# Patient Record
Sex: Female | Born: 1970
Health system: Southern US, Community
[De-identification: ages and names within clinical notes are randomized; demographics above are authoritative.]

## PROBLEM LIST (undated history)

## (undated) DIAGNOSIS — D649 Anemia, unspecified: Secondary | ICD-10-CM

## (undated) HISTORY — DX: Anemia, unspecified: D64.9

---

## 1998-05-13 ENCOUNTER — Other Ambulatory Visit: Admission: RE | Admit: 1998-05-13 | Discharge: 1998-05-13 | Payer: Self-pay | Admitting: Gynecology

## 1999-01-16 ENCOUNTER — Inpatient Hospital Stay (HOSPITAL_COMMUNITY): Admission: AD | Admit: 1999-01-16 | Discharge: 1999-01-16 | Payer: Self-pay | Admitting: Obstetrics and Gynecology

## 1999-02-17 ENCOUNTER — Other Ambulatory Visit: Admission: RE | Admit: 1999-02-17 | Discharge: 1999-02-17 | Payer: Self-pay | Admitting: Obstetrics and Gynecology

## 1999-09-08 ENCOUNTER — Inpatient Hospital Stay (HOSPITAL_COMMUNITY): Admission: AD | Admit: 1999-09-08 | Discharge: 1999-09-11 | Payer: Self-pay | Admitting: *Deleted

## 1999-09-12 ENCOUNTER — Encounter: Admission: RE | Admit: 1999-09-12 | Discharge: 1999-12-11 | Payer: Self-pay | Admitting: Obstetrics and Gynecology

## 1999-10-06 ENCOUNTER — Other Ambulatory Visit: Admission: RE | Admit: 1999-10-06 | Discharge: 1999-10-06 | Payer: Self-pay | Admitting: Obstetrics and Gynecology

## 2000-10-18 ENCOUNTER — Other Ambulatory Visit: Admission: RE | Admit: 2000-10-18 | Discharge: 2000-10-18 | Payer: Self-pay | Admitting: Obstetrics and Gynecology

## 2001-04-26 ENCOUNTER — Other Ambulatory Visit: Admission: RE | Admit: 2001-04-26 | Discharge: 2001-04-26 | Payer: Self-pay | Admitting: Obstetrics and Gynecology

## 2001-05-26 ENCOUNTER — Inpatient Hospital Stay (HOSPITAL_COMMUNITY): Admission: AD | Admit: 2001-05-26 | Discharge: 2001-05-26 | Payer: Self-pay | Admitting: Obstetrics and Gynecology

## 2001-12-06 ENCOUNTER — Inpatient Hospital Stay (HOSPITAL_COMMUNITY): Admission: AD | Admit: 2001-12-06 | Discharge: 2001-12-09 | Payer: Self-pay | Admitting: Obstetrics and Gynecology

## 2001-12-10 ENCOUNTER — Encounter: Admission: RE | Admit: 2001-12-10 | Discharge: 2002-01-09 | Payer: Self-pay | Admitting: Obstetrics and Gynecology

## 2002-07-10 ENCOUNTER — Other Ambulatory Visit: Admission: RE | Admit: 2002-07-10 | Discharge: 2002-07-10 | Payer: Self-pay | Admitting: Obstetrics and Gynecology

## 2002-07-15 ENCOUNTER — Encounter: Payer: Self-pay | Admitting: Obstetrics and Gynecology

## 2002-07-15 ENCOUNTER — Encounter: Admission: RE | Admit: 2002-07-15 | Discharge: 2002-07-15 | Payer: Self-pay | Admitting: Obstetrics and Gynecology

## 2002-08-03 ENCOUNTER — Emergency Department (HOSPITAL_COMMUNITY): Admission: EM | Admit: 2002-08-03 | Discharge: 2002-08-04 | Payer: Self-pay | Admitting: Emergency Medicine

## 2002-08-04 ENCOUNTER — Encounter: Payer: Self-pay | Admitting: Emergency Medicine

## 2002-08-06 ENCOUNTER — Encounter: Payer: Self-pay | Admitting: Specialist

## 2002-08-06 ENCOUNTER — Encounter: Admission: RE | Admit: 2002-08-06 | Discharge: 2002-08-06 | Payer: Self-pay | Admitting: Specialist

## 2003-03-10 ENCOUNTER — Encounter: Payer: Self-pay | Admitting: Obstetrics and Gynecology

## 2003-03-10 ENCOUNTER — Encounter: Admission: RE | Admit: 2003-03-10 | Discharge: 2003-03-10 | Payer: Self-pay | Admitting: Obstetrics and Gynecology

## 2003-05-09 ENCOUNTER — Ambulatory Visit (HOSPITAL_COMMUNITY): Admission: RE | Admit: 2003-05-09 | Discharge: 2003-05-09 | Payer: Self-pay | Admitting: *Deleted

## 2003-05-09 ENCOUNTER — Encounter (INDEPENDENT_AMBULATORY_CARE_PROVIDER_SITE_OTHER): Payer: Self-pay | Admitting: Specialist

## 2003-10-15 ENCOUNTER — Other Ambulatory Visit: Admission: RE | Admit: 2003-10-15 | Discharge: 2003-10-15 | Payer: Self-pay | Admitting: Obstetrics and Gynecology

## 2003-12-03 ENCOUNTER — Other Ambulatory Visit: Admission: RE | Admit: 2003-12-03 | Discharge: 2003-12-03 | Payer: Self-pay | Admitting: Obstetrics and Gynecology

## 2004-11-08 ENCOUNTER — Inpatient Hospital Stay (HOSPITAL_COMMUNITY): Admission: AD | Admit: 2004-11-08 | Discharge: 2004-11-08 | Payer: Self-pay | Admitting: Obstetrics and Gynecology

## 2004-12-02 ENCOUNTER — Other Ambulatory Visit: Admission: RE | Admit: 2004-12-02 | Discharge: 2004-12-02 | Payer: Self-pay | Admitting: Obstetrics and Gynecology

## 2005-01-14 ENCOUNTER — Ambulatory Visit (HOSPITAL_COMMUNITY): Admission: RE | Admit: 2005-01-14 | Discharge: 2005-01-14 | Payer: Self-pay | Admitting: Obstetrics and Gynecology

## 2005-02-07 ENCOUNTER — Inpatient Hospital Stay (HOSPITAL_COMMUNITY): Admission: AD | Admit: 2005-02-07 | Discharge: 2005-02-07 | Payer: Self-pay | Admitting: Obstetrics and Gynecology

## 2005-02-25 ENCOUNTER — Other Ambulatory Visit: Admission: RE | Admit: 2005-02-25 | Discharge: 2005-02-25 | Payer: Self-pay | Admitting: Obstetrics and Gynecology

## 2005-03-23 ENCOUNTER — Ambulatory Visit (HOSPITAL_COMMUNITY): Admission: RE | Admit: 2005-03-23 | Discharge: 2005-03-23 | Payer: Self-pay | Admitting: Obstetrics and Gynecology

## 2005-03-23 IMAGING — RF DG HYSTEROGRAM
4 series · 4 of 4 positions shown · non-contrast
Comparison: None.

CLINICAL DATA: Recurrent pregnancy loss.
 HYSTEROGRAM:

[Series 1: run · 1 of 1 slices shown (1 of 4)]
[im 1/1]
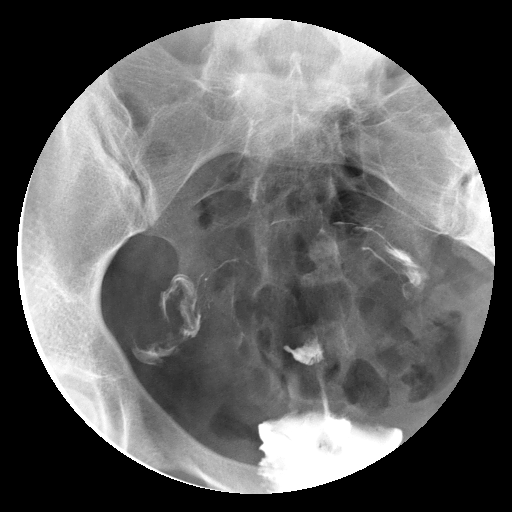

[Series 2: run · 1 of 1 slices shown (2 of 4)]
[im 1/1]
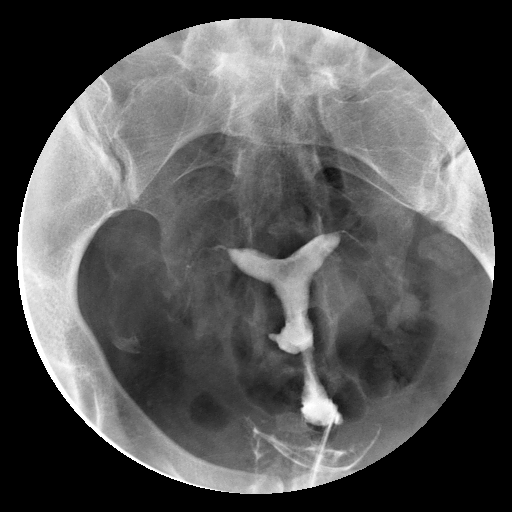

[Series 3: run · 1 of 1 slices shown (3 of 4)]
[im 1/1]
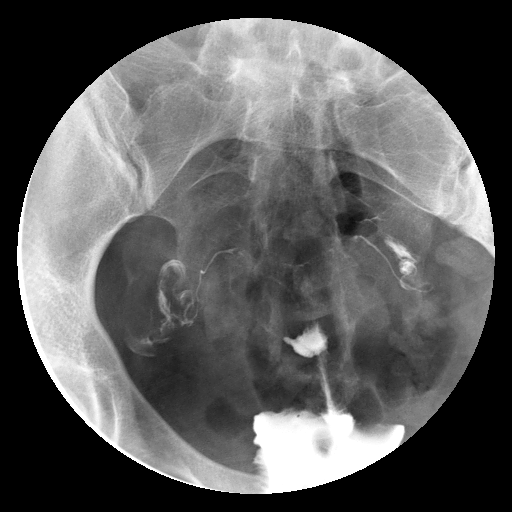

[Series 4: run · 1 of 1 slices shown (4 of 4)]
[im 1/1]
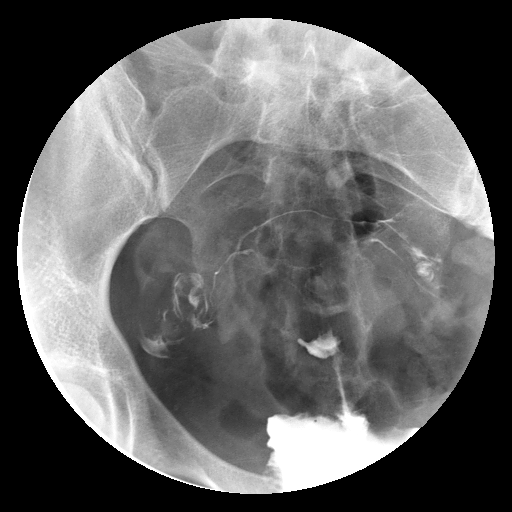

[4 of 4 positions shown; findings below may reference images not displayed]

Procedure was performed by Dr. TIN.  Four spot fluor-images were submitted after the procedure for review.
 The first image demonstrates a lack of contrast in the endometrial cavity although both ovarian tubes are opacified to the level of the ampulla bilaterally.  A second image shows the endometrial cavity to be of normal size and contour although the endometrial cavity is not well opacified.  There is a focal collection in the region of the lower uterine segment with a finger of contrast extending to the right.  This may be related to a component of fibroid change or scar.  On the third image the contrast has refluxed from the uterus into the vagina although both ovarian tubes remain opacified. Free spill of contrast from the right tube is noted on this image.  
 The final image also shows nonopacification of the endometrial cavity although contrast spill from the left ovarian tube is now apparent.
IMPRESSION: 1.  Patent ovarian tubes bilaterally.
 2.  Small collection of contrast at the level of the lower uterine segment/internal cervical os with a finger of contrast extending to the right.  This may be related to fibroid change or scarring, but is nonspecific in appearance.

## 2005-08-23 ENCOUNTER — Other Ambulatory Visit: Admission: RE | Admit: 2005-08-23 | Discharge: 2005-08-23 | Payer: Self-pay | Admitting: Obstetrics and Gynecology

## 2006-03-17 ENCOUNTER — Encounter (INDEPENDENT_AMBULATORY_CARE_PROVIDER_SITE_OTHER): Payer: Self-pay | Admitting: Specialist

## 2006-03-17 ENCOUNTER — Inpatient Hospital Stay (HOSPITAL_COMMUNITY): Admission: AD | Admit: 2006-03-17 | Discharge: 2006-03-20 | Payer: Self-pay | Admitting: Obstetrics and Gynecology

## 2006-03-21 ENCOUNTER — Encounter: Admission: RE | Admit: 2006-03-21 | Discharge: 2006-04-20 | Payer: Self-pay | Admitting: Obstetrics and Gynecology

## 2006-04-11 ENCOUNTER — Ambulatory Visit (HOSPITAL_COMMUNITY): Admission: RE | Admit: 2006-04-11 | Discharge: 2006-04-11 | Payer: Self-pay | Admitting: Obstetrics and Gynecology

## 2006-04-11 ENCOUNTER — Encounter: Payer: Self-pay | Admitting: Vascular Surgery

## 2006-04-21 ENCOUNTER — Encounter: Admission: RE | Admit: 2006-04-21 | Discharge: 2006-05-21 | Payer: Self-pay | Admitting: Obstetrics and Gynecology

## 2006-05-22 ENCOUNTER — Encounter: Admission: RE | Admit: 2006-05-22 | Discharge: 2006-05-30 | Payer: Self-pay | Admitting: Obstetrics and Gynecology

## 2006-07-22 ENCOUNTER — Encounter: Admission: RE | Admit: 2006-07-22 | Discharge: 2006-07-25 | Payer: Self-pay | Admitting: Obstetrics and Gynecology

## 2009-03-18 ENCOUNTER — Encounter: Admission: RE | Admit: 2009-03-18 | Discharge: 2009-03-18 | Payer: Self-pay | Admitting: Obstetrics and Gynecology

## 2010-03-22 ENCOUNTER — Encounter: Admission: RE | Admit: 2010-03-22 | Discharge: 2010-03-22 | Payer: Self-pay | Admitting: Obstetrics and Gynecology

## 2010-10-03 ENCOUNTER — Encounter: Payer: Self-pay | Admitting: Obstetrics and Gynecology

## 2011-01-28 NOTE — Discharge Summary (Signed)
[facility] of [facility]  Patient:    Brianna Ward, Brianna Ward Visit Number: [id number] MRN: [id number]          Service Type: OBS Location: 910A 9106 01 Attending Physician:  Esmeralda Arthur Dictated by:   Mack Guise, C.N.M. Admit Date:  12/06/2001 Discharge Date: 12/09/2001                             Discharge Summary  ADMITTING DIAGNOSES:          1. Intrauterine pregnancy at term.                               2. Previous low transverse cesarean section                                  with desire for repeat.  PROCEDURE:                    Repeat low transverse cesarean section.  DISCHARGE DIAGNOSES:          1. Intrauterine pregnancy at term.                               2. Previous low transverse cesarean section                                  with desire for repeat.                               3. Repeat low transverse cesarean section.  HISTORY OF PRESENT ILLNESS:   Ms. Fong is a 40 year old gravida 4, para 1-0-1-1, who presented at term for repeat cesarean delivery.  She underwent that procedure on December 06, 2001, by Dr. Dois Davenport Rivard with the birth of an 8 pound 9 ounce female infant named [name], with Apgar scores of 9 at one minute, 9 at five minutes.  Ms. Ventresca has done well in the immediate postoperative period.  Her hemoglobin on the first postoperative day is 10.0 and on the third postoperative day, her vital signs are stable and she is not experiencing any untoward difficulties.  She is judged to be in satisfactory condition for discharge.  DISCHARGE INSTRUCTIONS:       Per [facility] handout.  DISCHARGE MEDICATIONS:        1. Motrin 600 mg p.o. q.6h. p.r.n. pain.                               2. Tylox 1-2 p.o. q.3-4h. pain.                               3. Prenatal vitamins.  DISCHARGE FOLLOW-UP:          Discharge follow-up will be at [facility] in six weeks. Dictated by:   Mack Guise, C.N.M. Attending Physician:   Esmeralda Arthur DD:  12/09/01 TD:  12/09/01 Job: [id number]

## 2011-01-28 NOTE — H&P (Signed)
[facility] of [facility]  Patient:    Brianna Ward                        MRN: [id number] Adm. Date:  [id number] Attending:  Ardeen Fillers CC:         Sung Amabile. Roslyn Smiling, M.D.             Outpatient Surgery                         History and Physical  CHIEF COMPLAINT:  Breech presentation at 38+ weeks gestational age, borderline amniotic fluid.  HISTORY OF PRESENT ILLNESS:  A 40 year old woman, G3, P0-0-2-0, EDC 09/19/1999 based on dates and early ultrasound, admitted for primary cesarean section because of  persistent breech presentation, borderline low amniotic fluid (14th percentile n December 22) and slightly elevated Doppler studies.  The patient has been counseled regarding the benefits and risks of cesarean section versus external cephalic ____________ .  Because of the fluid and elevation of the Dopplers, cesarean section has been recommended.  She has been counseled regarding the benefits, risks and options prior to surgery and consent has been obtained.  Antenatal course has been remarkable for  1. First trimester bleeding.  The patient received progesterone suppositories until    [redacted] weeks gestational age. 2. Hyperemesis gravidarum requiring Zofran. 3. Anemia which appeared in the second trimester treated with iron. 4. Gastroesophageal reflux disease treated with Zantac in the second trimester. 5. Hemorrhoids diagnosed by Dr. Charna Elizabeth and treated with [facility]    suppositories. 6. Borderline low amniotic fluid diagnosed at the time of the breech presentation    in December.  The patient has had no evidence of pregnancy induced hypertension    and is a nonsmoker.  She has been observing partial bedrest which has not    affected the amount of fluid around the baby.  Estimated fetal weight was in the    61st percentile two weeks prior to delivery.  PAST MEDICAL HISTORY:  Pyelonephritis 1989.  PAST SURGICAL HISTORY:  None.  ALLERGIES:   None.  MEDICATIONS:  Prenatal vitamins and iron, occasional Zantac.  OBSTETRICAL:  1991 first trimester TAB without complications.  January 2000 first trimester spontaneous abortion without D&C.  FAMILY HISTORY:  Father with hypertension and heart disease.  SOCIAL HISTORY:  Married.  Audiologist.  Denies tobacco or ethanol use. Vegetarian.  PHYSICAL EXAMINATION:  GENERAL:  Healthy-appearing woman.  VITAL SIGNS:  Afebrile.  Recent blood pressure in the office 110/70.  Fetal heart tones heard.  Most recent weight 166 pounds (total weight gain 22).  HEENT:  Within normal limits.  NECK:  Without thyromegaly.  Chest clear.  HEART:  Regular rate and rhythm, S1 and S2 normal.  ABDOMEN:  Soft, nontender, gravid.  Breech presentation and fundal height 36 cm.  GU:  1 cm dilated, 2 cm long, -2 station.  EXTREMITIES:  Without clubbing, cyanosis or edema.  NEUROLOGIC:  2+ DTRs without clonus.  LABS (ANTENATAL):  O negative (the patient received RhoGAM in the first trimester after bleeding noted and again at 28 weeks).  RPR nonreactive.  Hepatitis B surface antigen negative.  HIV nonreactive.  Rubella immune.  CMV negative.  Maternal serum alpha fetoprotein triple screen within normal limits.  One hour Glucola 123 mg%, group B strep negative.  IMPRESSION: 1. Intrauterine pregnancy [redacted] weeks gestational age. 2. Breech presentation. 3. Borderline low amniotic  fluid without obvious etiology. 4. Rh negative. 5. Vegetarian.  PLAN:  Primary cesarean section.  RhoGAM work-up postpartum. DD:  09/08/99 TD:  09/08/99 Job: [id number]

## 2011-01-28 NOTE — H&P (Signed)
[facility] of [facility]  Patient:    Brianna Ward, Brianna Ward Visit Number: [id number] MRN: [id number]          Service Type: OBS Location: 910A 9106 01 Attending Physician:  Esmeralda Arthur Dictated by:   Nigel Bridgeman, C.N.M. Admit Date:  12/06/2001                           History and Physical  HISTORY OF PRESENT ILLNESS:   Ms. Brianna Ward is a 40 year old gravida 4, para 1-0-1-1, who presents today at approximately 38 weeks for a scheduled cesarean section secondary to previous cesarean section with the desire for repeat.  The pregnancy has been remarkable for: 1. Rh negative.  2. History of C-section with desire for repeat.  3. History of oligohydramnios in present pregnancy.  4. The patient a vegetarian.  5. History of anemia.  6. First trimester spotting.  PRENATAL LABORATORIES:        Blood type is O negative.  Rh antibody showed anti-D.  RPR is negative.  Rubella titer is immune.  Hepatitis B surface antigen is negative.  HIV is nonreactive.  GC and Chlamydia cultures were negative.  Group B strep culture was negative at 36 weeks.  EDC of December 13, 2001, was established by last menstrual period; [facility] of December 09, 2001, was established by ultrasound in August.  HISTORY OF PRESENT PREGNANCY:                    The patient entered care at [facility] at approximately six weeks.  She had an ultrasound at seven weeks that showed accurate dating.  She originally was undecided about a VBAC, but then elected to repeat her C-section.  She had RhoGAM received in August and had a subsequent anti-D antibody titer done.  CMV titers were done which were negative.  She had some first trimester spotting, but then resolved.  She did use Zofran for early pregnancy nausea and vomiting.  AFP was normal.  She had an ultrasound at 21 weeks that showed normal development.  She elected to transfer her care to [facility] in the late third trimester upon Dr. Antonietta Breach leaving to  go to [facility].  OBSTETRICAL HISTORY:          In 1991 she had a therapeutic termination of pregnancy without complication.  In January of 2000 she had a spontaneous miscarriage at 2-1/2 months pregnancy; she had no D&C and no complications. In December of 2000 she had a planned C-section, primary low transverse cesarean section, for a female infant, weight 6 pounds 11 ounces at [redacted] weeks gestation; she was found to have a breech infant and oligohydramnios.  She did have some borderline gestational anemia with that last pregnancy and oligohydramnios.  She did receive RhoGAM.  MEDICAL HISTORY:              She had a TAB in 1991.  She reports she has received the usual childhood illnesses and immunizations.  The patient has had anemia in the past as a child.  She had pyelonephritis in 1989.  She was hospitalized at that time.  ALLERGIES:                    She has no known medication allergies.  FAMILY HISTORY:               Her father, paternal grandfather and paternal uncle all  have had MIs.  Father is hypertensive, on medication.  Maternal great-uncle did have some mental illnesses.  GENETIC HISTORY:              Remarkable for the patients second cousin with Tourettes syndrome.  SOCIAL HISTORY:               The patient is married to the father of the baby, he is involved and supportive, his name is Jillyn Hidden.  The patient is graduate educated, she is employed as an Biomedical scientist.  Her husband is employed in Airline pilot, he is college educated.  She has been followed by Dr. Estanislado Pandy during her pregnancy.  She is Caucasian.  She denies any alcohol, drug or tobacco use during this pregnancy.  PHYSICAL EXAMINATION:  VITAL SIGNS:                  Vital signs are stable, patient is afebrile.  HEENT:                        Within normal limits.  LUNGS:                        Bilateral breath sounds are clear.  HEART:                        Regular rate and rhythm without  murmur.  BREASTS:                      Soft and nontender.  ABDOMEN:                      Fundal height is approximately 38 cm.  Estimated fetal weight is 8 pounds to 8 pounds 5 ounces.  Uterine contractions are very occasional and mild.  Fetal heart rate is in the 140s by Doppler.  EXTREMITIES:                  Deep tendon reflexes are 2+ without clonus. There is a trace edema noted.  IMPRESSION: 1. Intrauterine pregnancy at 38 weeks. 2. Previous low transverse cesarean section, with desire for repeat. 3. Rh negative.  PLAN: 1. Admit to the [facility] for consult with Dr. Silverio Lay as attending physician. 2. Routine physician preoperative orders. Dictated by:   Nigel Bridgeman, C.N.M. Attending Physician:  Esmeralda Arthur DD:  12/07/01 TD:  12/07/01 Job: [id number]

## 2011-01-28 NOTE — Op Note (Signed)
   NAMEELEKTRA, WARTMAN                          ACCOUNT NO.:  [id number]   MEDICAL RECORD NO.:  [id number]                   PATIENT TYPE:  AMB   LOCATION:  DAY                                  FACILITY:  [facility]   PHYSICIAN:  Vikki Ports, M.D.         DATE OF BIRTH:  1971-04-18   DATE OF PROCEDURE:  05/09/2003  DATE OF DISCHARGE:                                 OPERATIVE REPORT   PREOPERATIVE DIAGNOSIS:  Bilateral breast masses.   POSTOPERATIVE DIAGNOSIS:  Bilateral breast masses.   PROCEDURE:  Excisional bilateral breast biopsies.   SURGEON:  Vikki Ports, M.D.   ANESTHESIA:  General.   DESCRIPTION OF PROCEDURE:  The patient was taken to the operating room,  placed in supine position and after adequate general anesthesia was induced  using laryngeal mask, bilateral breasts were prepped and draped in the  normal sterile fashion. Using a curvilinear periareolar incision in the  right breast, I dissected down onto the palpable mass in the left areolar  region. This was grasped with an Allis clamp and excised. Adequate  hemostasis was ensured and the skin was closed with subcuticular 4-0  Monocryl.   I then turned my attention to the left breast and a similar incision was  made in the 7 o'clock region of the periareolar breast. We dissected down to  the palpable retro-areolar mass which was grasped with an Allis clamp,  excised, adequate hemostasis was ensured using Bovie electrocautery. The  skin was closed with subcuticular 4-0 Monocryl, Steri-Strips and sterile  dressings were applied. The patient tolerated the procedure well and went to  PACU in good condition.                                               Vikki Ports, M.D.    KRH/MEDQ  D:  05/09/2003  T:  05/10/2003  Job:  [id number]

## 2011-01-28 NOTE — H&P (Signed)
[facility] of [facility]  Patient:    Brianna Ward, Brianna Ward Visit Number: [id number] MRN: [id number]          Service Type: OBS Location: MATC Attending Physician:  Esmeralda Arthur Admit Date:  05/26/2001                           History and Physical  ER NOTE  REASON FOR CONSULTATION:      Low back pain.  HISTORY OF PRESENT ILLNESS:   This is a 40 year old married white female, gravida 4, para 1, at [redacted] weeks pregnant, who is complaining of lower back pain for the last 24 hours with a low-grade fever last night of 101.5, now back to 99.  She is known for hyperemesis currently using folic acid and Zofran and has been keeping her food and her fluids down, but for the last 24-48 hours, she has been not feeling very good, low-grade fever, low back pain, no bleeding, no abdominal pain.  Bowel movements have been difficult with constipation.  She has been exposed in the last week to two persons, her mother and her neighbor, with a 24-hour bug, low-grade fever, and diarrhea.  REVIEW OF SYSTEMS:            Otherwise negative.  PAST MEDICAL HISTORY:         1. Cesarean section in 2000.                               2. History of pyelonephritis in 1989.                               3. History of first trimester bleeding, now                                  resolved.                               4. Previous history of oligohydramnios.  SOCIAL HISTORY:               Married, nonsmoker.  PHYSICAL EXAMINATION:  VITAL SIGNS:                  Blood pressure 114/68, current temperature 99.8, pulse 89, no acute distress.  HEENT:                        Negative.  LUNGS:                        Clear.  HEART:                        Normal.  ABDOMEN:                      Bowel sounds present, soft, nontender, no CVA tenderness.  EXTREMITIES:                  Negative.  FETAL HEART RATE:             Positive at 150 per minute.  LABORATORY:  A cath  urinalysis is negative.  ASSESSMENT:                   Intrauterine pregnancy at 11 weeks with low back pain and low-grade fever in a patient exposed to viremia in the last week. This is a probable viral infection with myalgia.  No evidence of urinary tract infection or bacterial infection.  PLAN:                         The patient is discharged home reassured, instructed to use Tylenol 650 mg q.4-6h. around-the-clock for the next 24-48 hours and to monitor her temperature.  She is instructed to call back if temperature is above 100.4.  She is also instructed to drink plenty of water and to use Fibercon for her constipation.  She will be seen in the office on May 29, 2001, for a follow-up. Attending Physician:  Esmeralda Arthur DD:  05/26/01 TD:  05/26/01 Job: [id number]

## 2011-01-28 NOTE — Op Note (Signed)
[name], Brianna Ward                ACCOUNT NO.:  [id number]   MEDICAL RECORD NO.:  [id number]          PATIENT TYPE:  INP   LOCATION:  9124                          FACILITY:  [facility]   PHYSICIAN:  Michelle L. Grewal, M.D.DATE OF BIRTH:  1971/07/13   DATE OF PROCEDURE:  03/17/2006  DATE OF DISCHARGE:                                 OPERATIVE REPORT   PREOPERATIVE DIAGNOSIS:  Intrauterine pregnancy at term, previous cesarean  section x2 and desires permanent sterilization.   POSTOPERATIVE DIAGNOSIS:  Intrauterine pregnancy at term, previous cesarean  section x2 and desires permanent sterilization.   PROCEDURE:  Repeat low transverse cesarean section and bilateral tubal  ligation modified Pomeroy method.   SURGEON:  Michelle L. Vincente Poli, M.D.   ANESTHESIA.:  Spinal.   SPECIMENS:  Female infant, cephalic presentation, Apgars 8 at one minute, 9  at five minutes, weighing 6 pounds 14 ounces.   ESTIMATED BLOOD LOSS:  500 mL.   PROCEDURE:  Patient taken to the operating room.  Her spinal was placed  without incident.  She was then prepped and draped in the usual sterile  fashion.  A Foley catheter was inserted.  A low transverse incision was  made, carried down to the fascia.  The fascia scored in the midline and  extended laterally.  The rectus muscles were separated in the midline and  the peritoneum was entered.  The bladder blade was inserted.  The lower  uterine segment was identified and the bladder flap was created sharply and  then digitally. The bladder blade was readjusted.  A low transverse incision  was made in the uterus.  The uterus was entered using a hemostat.  The  amniotic fluid was clear.  The baby was in cephalic presentation and was  delivered easily with a vacuum extractor.  Apgars were 8 at one minute and 9  at five minutes and the baby weighed 6 pounds, 14 ounces.  The cord was  clamped and cut, the baby was handed to the waiting pediatrician. The  placenta was  manually removed, noted be normal, intact with three-vessel  cord.  The uterus was then exteriorized and cleared of all clots and debris.  The uterine incision was closed in one layer using 0 chromic suture,  continuous running stitch.  It was __________ hemostat.  At this point we  then performed a modified Pomeroy bilateral tubal ligation by grasping each  midportion of tube with a Babcock.  The fimbriated ends were visualized  easily.  A 3 cm knuckle tied off using plain gut suture x2 and the tied off  knuckle was excised using Metzenbaum scissors.  Hemostasis was excellent.  The uterus was returned to the abdomen.  Irrigation was performed.  All  three surgical pedicles were inspected and noted to be hemostatic.  Peritoneum and  rectus muscles were reapproximated using 0 Vicryl.  The fascia was closed  using 0 Vicryl x2 starting each corner meeting in the midline.  After  irrigation of the subcutaneous layer. The skin was closed with staples.  All  sponge, lap and counts were correct  x2.  The patient went recovery room in  stable condition.      Michelle L. Vincente Poli, M.D.  Electronically Signed     MLG/MEDQ  D:  03/17/2006  T:  03/17/2006  Job:  [id number]

## 2011-01-28 NOTE — Discharge Summary (Signed)
[name], [name]                ACCOUNT NO.:  [id number]   MEDICAL RECORD NO.:  [id number]          PATIENT TYPE:  INP   LOCATION:  9128                          FACILITY:  [facility]   PHYSICIAN:  Michelle L. Grewal, M.D.DATE OF BIRTH:  10-14-1970   DATE OF ADMISSION:  03/17/2006  DATE OF DISCHARGE:  03/20/2006                                 DISCHARGE SUMMARY   ADMITTING DIAGNOSES:  1. Intrauterine pregnancy at term.  2. Previous cesarean section, desires repeat.  3. Multiparity, desires permanent sterilization.   DISCHARGE DIAGNOSES:  1. Status post low transverse cesarean section.  2. Viable female infant.  3. Permanent sterilization.   PROCEDURE:  1. Repeat low transverse cesarean section.  2. Bilateral tubal ligation.   REASON FOR ADMISSION:  Please see written H&P.   HOSPITAL COURSE:  The patient is a 40 year old white married female gravida  6, para 2 that presented to [facility] for a scheduled  cesarean section.  Due to multiparity, the patient has also requested  bilateral tubal ligation.  On the morning of admission the patient was taken  to the operating room where spinal anesthesia was administered without  difficulty.  A low transverse incision was made with the delivery of a  viable female infant weighing 6 pounds 14 ounces with Apgars of 8 at one  minute and 9 at five minutes.  The patient tolerated her procedure well and  was taken to the recovery room in stable condition.  On postoperative day #1  the patient was without complaint.  Vital signs were stable.  She was  afebrile.  Fundus firm and nontender.  Abdominal dressing was noted with a  small amount of serosanguineous drainage noted on bandage.  Laboratory  findings showed hemoglobin of 10.2; platelet count 140,000.  The patient was  known to be Rh negative and RhoGAM was indicated.  On postoperative day #2  the patient was without complaint.  Vital signs were stable.  She was  afebrile.   Fundus firm and nontender.  Incision was clean, dry and intact.  CBC had revealed platelets up to 149,000.  The patient was now tolerating a  regular diet without complaints of nausea and vomiting.  On postoperative  day #3 the patient was without complaint.  Vital signs remained stable.  She  was afebrile.  Fundus firm and nontender.  Incision was clean, dry and  intact.  Staples were removed and the patient was discharged home.   CONDITION ON DISCHARGE:  Stable.   DIET:  Regular as tolerated.   ACTIVITY:  No heavy lifting, no driving x2 weeks, no vaginal entry.   FOLLOWUP:  The patient is to follow up in the office in 2 weeks for an  incision check.  She is to call for temperature greater than 100 degrees,  persistent nausea and vomiting, heavy vaginal bleeding, and/or redness or  drainage from the incisional site.   DISCHARGE MEDICATIONS:  1. Tylox #30 one p.o. q.4-6h. p.r.n.  2. Motrin 600 mg every 6 hours.  3. Prenatal vitamins one p.o. daily.  4. Colace one p.o.  daily p.r.n.      Julio Sicks, N.P.      Stann Mainland. Vincente Poli, M.D.  Electronically Signed    CC/MEDQ  D:  05/02/2006  T:  05/02/2006  Job:  [id number]

## 2011-01-28 NOTE — Op Note (Signed)
[facility]  Patient:    Brianna Ward                        MRN: [id number] Proc. Date: 09/08/99 Adm. Date:  [id number] Attending:  Ardeen Fillers CC:         Sung Amabile. Roslyn Smiling, M.D.                           Operative Report  PREOPERATIVE DIAGNOSIS:  Breech presentation.  Intrauterine pregnancy at 38+ weeks gestational age.  Borderline low amniotic fluid index.  POSTOPERATIVE DIAGNOSIS:  Breech presentation.  Intrauterine pregnancy at 38+ weeks gestational age.  Borderline low amniotic fluid index.  Fibroids.  OPERATION PERFORMED:  Primary low transverse cesarean section.  SURGEON:  Sung Amabile. Roslyn Smiling, M.D.  ASSISTANT:  Lenoard Aden, M.D.  ANESTHESIA:  Spinal.  INDICATIONS FOR PROCEDURE:  The patient is a 40 year old woman, G3, P0-0-2-0, at 38+ weeks gestational age admitted for primary cesarean section delivery for breech presentation and borderline low AFI.  ESTIMATED BLOOD LOSS:  600 cc.  DRAINS:  Foley catheter.  COMPLICATIONS:  None.  FINDINGS:  Live frank breech female, 3135 gm, Apgars at 8 and 9.  Clear fluid. Normal tubes and ovaries.  Subserosal fibroid in the anterior left lower uterine segment and posterior left lower uterine segment.  SPECIMENS:  Cord bloods.  DESCRIPTION OF PROCEDURE:  After the establishment of spinal anesthesia, the patient was placed in the left uterine displaced position.  The abdomen was prepped, Foley catheter was placed, the patient was draped.  A Pfannenstiel incision was made on the skin with a knife.  The incision was carried to the level of the fascia using electrocautery.  The fascia was nicked in the midline.  The  fascial incision was carried laterally using scissors.  The fascia was separated from the underlying rectus muscle bellies with a combination of sharp and blunt  dissection.  The upper border of the fascia was retracted cephalad.  The rectus  muscle bellies were split in  the midline sharply.  The peritoneum was visualized and grasped in a clear space and entered there bluntly.  The peritoneal incision was carried superiorly and inferiorly taking care to avoid injury to underlying  visceral  Bladder blade was placed.  Uterine position was ascertained. Vesicouterine serosa was opened over lower uterine segment transversely.  The bladder flap was created bluntly and the bladder blade was placed on the bladder flap.  A low transverse incision was made on the uterus with a knife.  The incision was carried laterally with bandage scissors.  The presentation was right sacrum anterior.  The breech was delivered without difficulty and the baby delivered and the nasopharynx suctioned.  The cord was clamped and cut and the baby was handed off the table to the awaiting pediatricians.  Cord bloods were taken.  The placenta was removed spontaneously.  Ring forceps were used to identify the angles of the uterine incision.  The intrauterine cavity was wiped clean inside.  The uterus was exteriorized.  The uterus was closed in layers.  The first layer was a running interlocking stitch of 0 Monocryl.  The second layer was an imbricating stitch of 0 Monocryl.  Bleeding at the right angle of the uterine incision was persistent and required three single sutures through the broad ligament, then into the right border of the myometrium to control bleeding.  A small amount of bleeding was noted at the left border of the incision.  This was controlled with a mattress suture of 0 Vicryl.  Hemostasis was noted.  The uterus was returned to the abdominal cavity.  The peritoneal cavity was irrigated with warm normal saline.  Initial sponge, needle and blade counts were correct.  Hemostasis was noted.  The anterior abdominal wall was closed in layers.  The subfascial layer was irrigated with warm normal saline.  Electrocautery was used for hemostasis. Vicryl 2-0 mattress  was used to reapproximate the lower border of the rectus muscle bellies.  The fascia was closed from either angle using a running stitch of 0 Vicryl tying in the midline independently.  The subcutaneous fat was irrigated ith warm normal saline.  Electrocautery was used for hemostasis.  0.25% Marcaine was used to infiltrate the incisional edges.  The skin was closed with staples. Final sponge, needle and blade counts were correct.  The urine was clear at the end of the case.  The patient was transported to the recovery room in satisfactory condition. DD:  09/08/99 TD:  09/09/99 Job: [id number]

## 2011-01-28 NOTE — Op Note (Signed)
[facility]  Patient:    Brianna Ward, Brianna Ward Visit Number: [id number] MRN: [id number]          Service Type: OBS Location: 910A [id number] [id number] Attending Physician:  Esmeralda Arthur Dictated by:   Silverio Lay, M.D. Admit Date:  12/06/2001                             Operative Report  PREOPERATIVE DIAGNOSIS:       Intrauterine pregnancy at 39 weeks with previous                               cesarean section.  POSTOPERATIVE DIAGNOSIS:      Intrauterine pregnancy at 39 weeks with previous                               cesarean section.  OPERATION:                    Repeat low transverse cesarean section.  SURGEON:                      Silverio Lay, M.D.  ASSISTANT:                    Nigel Bridgeman, C.N.M.  ANESTHESIA:                   Spinal.  ESTIMATED BLOOD LOSS:         600 cc.  DESCRIPTION OF PROCEDURE:     After being informed of the planned procedure with possible complications including bleeding, infection, injury to bowel, bladder, or ureter, informed consent was obtained, and the patient was taken to the cesarean suite, and given spinal anesthesia without complication.  She was placed in the dorsal decubitus with pelvis tilted to the left, prepped and draped in a sterile fashion, and a Foley catheter was inserted in her bladder.  After assessing adequate level of anesthesia, previous Pfannenstiel incision was infiltrated with 10 cc of Marcaine 0.25.  We proceeded with an incision of skin, subcutaneous tissue, and fat with knife overlooking the previous incision.  We then proceeded with incision of fascia in the transverse fashion, first with knife, and then with Mets.  At that time, we noted a complete diastasis of rectus abdominis muscles.  We then proceeded with opening of the visceroperitoneum with Met scissors, allowing [place] to develop bladder flap and safely retract the bladder.  In that process, due to large blood vessels, some  bleeding occurred, and was controlled with two figure-of-eight stitches of 3-0 Vicryl.  We then proceeded with opening of the myometrium in a low transverse fashion, first with knife, and then bluntly. Amniotic fluid was clear.  We assisted the birth of a female infant using vacuum extraction in a left OA presentation at 60.  Mouth and nose were suctioned. Cord was clamped with two Kelly clamps and sectioned, and the baby was given to the pediatrician present in the room.  Twenty cubic centimeters of blood were drawn from the umbilical vein, and placenta was allowed to deliver spontaneously.  The placenta was complete.  The cord had three vessels and uterine revision was negative.  We then proceeded with closure of myometrium in two planes, first with a running locked suture of 0  Vicryl, then with a Lembert suture of 0 Vicryl, covering the first one.  Hemostasis was then completed with cautery. Hemostasis was felt to be adequate.  Both pericolic gutters were cleansed with warm saline.  Both tubes and ovaries assessed and normal.  We then irrigated the pelvis with warm saline and assessed hemostasis which was adequate.  Under fascia, hemostasis was completed with cautery, and the rectus abdominis muscle were reapproximated with three figure-of-eight stitches of 0 Vicryl. We then proceeded with closure of fascia with two running sutures of 0 Vicryl meeting midline.  Fat was irrigated with warm saline.  Hemostasis was completed with cautery and the skin was closed with staples.  A baby boy named [name] had an Apgar of 9 at one minute and 9 at five minutes, and weighed 8 pounds and 9 ounces.  Instrument and sponge count is complete x2. Estimated blood loss is 600 cc.  The procedure was very well-tolerated by the patient who is taken to the recovery room in a well and stable condition. Dictated by:   Silverio Lay, M.D. Attending Physician:  Esmeralda Arthur DD:  12/06/01 TD:   12/08/01 Job: [id number]

## 2011-01-28 NOTE — Discharge Summary (Signed)
[facility]  Patient:    Brianna Ward                        MRN: [id number] Adm. Date:  [id number] Disc. Date: [id number] Attending:  Ardeen Fillers                           Discharge Summary  DISCHARGE DIAGNOSES:          1. Intrauterine pregnancy at term.                               2. Breech presentation.                               3. Borderline low amniotic fluid.                               4. Rh negative, RhoGAM given postpartum.                               5. Subserosal fibroid.                               6. Postoperative anemia.  PROCEDURE:                    Primary low transverse cesarean section.  HISTORY OF PRESENT ILLNESS:   A 40 year old woman, gravida 3, para 0-0-2-0, [facility]  September 19, 1999, based on dates and early ultrasound, admitted for primary cesarean section because of persistent breech presentation and borderline low amniotic fluid with slightly elevated Doppler studies.  The patient had been counseled regarding the benefits and risks of a cesarean section versus external cephalic version.  Because of decreased fluid and elevation of Dopplers cesarean section has been recommended.  She was counseled regarding the benefits, risks, and options prior to surgery and consent has been obtained.  The antenatal course was remarkable for:  1) First trimester bleeding.  The patient received progesterone suppositories until [redacted] weeks gestational age. 2) Hyperemesis gravidarum requiring Zofran.  3) Anemia which appeared in the second trimester treated with iron.  4) Gastroesophageal reflux treated with Zantac in the second trimester.  5) Hemorrhoids diagnosed by Anselmo Rod, M.D. treated with Anusol HC suppositories.  6) Borderline low amniotic fluid diagnosed at the time of the breech presentation in December.  The patient had no evidence of pregnancy-induced hypertension and is a nonsmoker.  She has been  observing partial bed rest which has not effected the amount of fluid around the baby.  Estimated  fetal weight was in the 61st percentile two weeks prior to delivery.  HOSPITAL COURSE:              The patient was admitted to [facility] [place].  She was taken to surgery on the date of admission where she underwent primary low transverse cesarean section by Sung Amabile. Roslyn Smiling, M.D. and Lenoard Aden, M.D. under spinal anesthesia.  Estimated blood loss was 600 cc and there were no complications.  At the time of delivery, a live, frank breech female, 3135 grams with Apgars of  8 and 9 was encountered.  A subserosal fibroid was noted in the anterior left lower uterine segment and posterior left lower uterine segment. These were left intact.  The patients postpartum course was remarkable for mild anemia.  Hemoglobin stabilized at 9.1.  This was well tolerated and treated conservatively.  Diet was advanced without difficulty.  The patient received RhoGAM postpartum.  Surgical  staples were removed on the third postoperative day and she was sent home that ay in satisfactory condition.  Routine status post cesarean section instructions were given.  DISCHARGE MEDICATIONS:        1. Prenatal vitamins.                               2. Iron.                               3. Tylox tablets.                               4. Nonsteroidal anti-inflammatory agents was also                                  prescribed.  FOLLOW-UP:                    She will be followed up in the office in four to ix weeks. DD:  10/02/99 TD:  10/05/99 Job: [id number]

## 2011-05-03 ENCOUNTER — Emergency Department (HOSPITAL_COMMUNITY)
Admission: EM | Admit: 2011-05-03 | Discharge: 2011-05-03 | Disposition: A | Discharge status: Home / Self Care | Payer: BC Managed Care – PPO | Attending: Emergency Medicine | Admitting: Emergency Medicine

## 2011-05-03 DIAGNOSIS — R61 Generalized hyperhidrosis: Secondary | ICD-10-CM | POA: Insufficient documentation

## 2011-05-03 DIAGNOSIS — R0789 Other chest pain: Secondary | ICD-10-CM | POA: Insufficient documentation

## 2012-03-21 ENCOUNTER — Other Ambulatory Visit: Payer: Self-pay | Admitting: Obstetrics and Gynecology

## 2012-07-20 ENCOUNTER — Other Ambulatory Visit: Payer: Self-pay | Admitting: Obstetrics and Gynecology

## 2013-08-14 ENCOUNTER — Ambulatory Visit (INDEPENDENT_AMBULATORY_CARE_PROVIDER_SITE_OTHER): Payer: BC Managed Care – PPO | Admitting: Podiatry

## 2013-08-14 ENCOUNTER — Encounter: Payer: Self-pay | Admitting: Podiatry

## 2013-08-14 ENCOUNTER — Ambulatory Visit (INDEPENDENT_AMBULATORY_CARE_PROVIDER_SITE_OTHER): Payer: BC Managed Care – PPO

## 2013-08-14 VITALS — BP 111/75 | HR 77 | Resp 16 | Ht 67.0 in | Wt 136.0 lb

## 2013-08-14 DIAGNOSIS — M722 Plantar fascial fibromatosis: Secondary | ICD-10-CM

## 2013-08-14 MED ORDER — TRIAMCINOLONE ACETONIDE 10 MG/ML IJ SUSP
10.0000 mg | Freq: Once | INTRAMUSCULAR | Status: AC
  Administered 2013-08-14: 10 mg

## 2013-08-14 NOTE — Progress Notes (Signed)
Subjective:     Patient ID: Brianna Ward, female   DOB: [birth year], 42 y.o.   MRN: [id number]  Foot Pain   patient states I am getting a lot of pain in my left heel that has been worse for the last month. I was on steroids which has given me some improvement but still quite painful and I have tried insoles ice therapy and shoe gear modification   Review of Systems  All other systems reviewed and are negative.       Objective:   Physical Exam  Nursing note and vitals reviewed. Constitutional: She is oriented to person, place, and time.  Cardiovascular: Intact distal pulses.   Musculoskeletal: Normal range of motion.  Neurological: She is oriented to person, place, and time.  Skin: Skin is warm.   neurovascular status intact with no equinus condition noted and muscle strength adequate bilateral. Upon palpation left heel is very tender at the medial fascial band calcaneal insertion with fluid buildup noted     Assessment:     Plantar fasciitis left heel with inflammation and fluid buildup at the insertion    Plan:     H&P and x-ray reviewed with patient. Injected the left plantar fascia 3 mg Kenalog 5 mg Xylocaine Marcaine mixture and applied fascially brace with instructions. If symptoms continue reappoint

## 2013-08-14 NOTE — Patient Instructions (Signed)
Plantar Fasciitis (Heel Spur Syndrome)  with Rehab  The plantar fascia is a fibrous, ligament-like, soft-tissue structure that spans the bottom of the foot. Plantar fasciitis is a condition that causes pain in the foot due to inflammation of the tissue.  SYMPTOMS   · Pain and tenderness on the underneath side of the foot.  · Pain that worsens with standing or walking.  CAUSES   Plantar fasciitis is caused by irritation and injury to the plantar fascia on the underneath side of the foot. Common mechanisms of injury include:  · Direct trauma to bottom of the foot.  · Damage to a small nerve that runs under the foot where the main fascia attaches to the heel bone.  · Stress placed on the plantar fascia due to bone spurs.  RISK INCREASES WITH:   · Activities that place stress on the plantar fascia (running, jumping, pivoting, or cutting).  · Poor strength and flexibility.  · Improperly fitted shoes.  · Tight calf muscles.  · Flat feet.  · Failure to warm-up properly before activity.  · Obesity.  PREVENTION  · Warm up and stretch properly before activity.  · Allow for adequate recovery between workouts.  · Maintain physical fitness:  · Strength, flexibility, and endurance.  · Cardiovascular fitness.  · Maintain a health body weight.  · Avoid stress on the plantar fascia.  · Wear properly fitted shoes, including arch supports for individuals who have flat feet.  PROGNOSIS   If treated properly, then the symptoms of plantar fasciitis usually resolve without surgery. However, occasionally surgery is necessary.  RELATED COMPLICATIONS   · Recurrent symptoms that may result in a chronic condition.  · Problems of the lower back that are caused by compensating for the injury, such as limping.  · Pain or weakness of the foot during push-off following surgery.  · Chronic inflammation, scarring, and partial or complete fascia tear, occurring more often from repeated injections.  TREATMENT   Treatment initially involves the use of  ice and medication to help reduce pain and inflammation. The use of strengthening and stretching exercises may help reduce pain with activity, especially stretches of the Achilles tendon. These exercises may be performed at home or with a therapist. Your caregiver may recommend that you use heel cups of arch supports to help reduce stress on the plantar fascia. Occasionally, corticosteroid injections are given to reduce inflammation. If symptoms persist for greater than 6 months despite non-surgical (conservative), then surgery may be recommended.   MEDICATION   · If pain medication is necessary, then nonsteroidal anti-inflammatory medications, such as aspirin and ibuprofen, or other minor pain relievers, such as acetaminophen, are often recommended.  · Do not take pain medication within 7 days before surgery.  · Prescription pain relievers may be given if deemed necessary by your caregiver. Use only as directed and only as much as you need.  · Corticosteroid injections may be given by your caregiver. These injections should be reserved for the most serious cases, because they may only be given a certain number of times.  HEAT AND COLD  · Cold treatment (icing) relieves pain and reduces inflammation. Cold treatment should be applied for 10 to 15 minutes every 2 to 3 hours for inflammation and pain and immediately after any activity that aggravates your symptoms. Use ice packs or massage the area with a piece of ice (ice massage).  · Heat treatment may be used prior to performing the stretching and strengthening activities prescribed   by your caregiver, physical therapist, or athletic trainer. Use a heat pack or soak the injury in warm water.  SEEK IMMEDIATE MEDICAL CARE IF:  · Treatment seems to offer no benefit, or the condition worsens.  · Any medications produce adverse side effects.  EXERCISES  RANGE OF MOTION (ROM) AND STRETCHING EXERCISES - Plantar Fasciitis (Heel Spur Syndrome)  These exercises may help you  when beginning to rehabilitate your injury. Your symptoms may resolve with or without further involvement from your physician, physical therapist or athletic trainer. While completing these exercises, remember:   · Restoring tissue flexibility helps normal motion to return to the joints. This allows healthier, less painful movement and activity.  · An effective stretch should be held for at least 30 seconds.  · A stretch should never be painful. You should only feel a gentle lengthening or release in the stretched tissue.  RANGE OF MOTION - Toe Extension, Flexion  · Sit with your right / left leg crossed over your opposite knee.  · Grasp your toes and gently pull them back toward the top of your foot. You should feel a stretch on the bottom of your toes and/or foot.  · Hold this stretch for __________ seconds.  · Now, gently pull your toes toward the bottom of your foot. You should feel a stretch on the top of your toes and or foot.  · Hold this stretch for __________ seconds.  Repeat __________ times. Complete this stretch __________ times per day.   RANGE OF MOTION - Ankle Dorsiflexion, Active Assisted  · Remove shoes and sit on a chair that is preferably not on a carpeted surface.  · Place right / left foot under knee. Extend your opposite leg for support.  · Keeping your heel down, slide your right / left foot back toward the chair until you feel a stretch at your ankle or calf. If you do not feel a stretch, slide your bottom forward to the edge of the chair, while still keeping your heel down.  · Hold this stretch for __________ seconds.  Repeat __________ times. Complete this stretch __________ times per day.   STRETCH  Gastroc, Standing  · Place hands on wall.  · Extend right / left leg, keeping the front knee somewhat bent.  · Slightly point your toes inward on your back foot.  · Keeping your right / left heel on the floor and your knee straight, shift your weight toward the wall, not allowing your back to  arch.  · You should feel a gentle stretch in the right / left calf. Hold this position for __________ seconds.  Repeat __________ times. Complete this stretch __________ times per day.  STRETCH  Soleus, Standing  · Place hands on wall.  · Extend right / left leg, keeping the other knee somewhat bent.  · Slightly point your toes inward on your back foot.  · Keep your right / left heel on the floor, bend your back knee, and slightly shift your weight over the back leg so that you feel a gentle stretch deep in your back calf.  · Hold this position for __________ seconds.  Repeat __________ times. Complete this stretch __________ times per day.  STRETCH  Gastrocsoleus, Standing   Note: This exercise can place a lot of stress on your foot and ankle. Please complete this exercise only if specifically instructed by your caregiver.   · Place the ball of your right / left foot on a step, keeping   your other foot firmly on the same step.  · Hold on to the wall or a rail for balance.  · Slowly lift your other foot, allowing your body weight to press your heel down over the edge of the step.  · You should feel a stretch in your right / left calf.  · Hold this position for __________ seconds.  · Repeat this exercise with a slight bend in your right / left knee.  Repeat __________ times. Complete this stretch __________ times per day.   STRENGTHENING EXERCISES - Plantar Fasciitis (Heel Spur Syndrome)   These exercises may help you when beginning to rehabilitate your injury. They may resolve your symptoms with or without further involvement from your physician, physical therapist or athletic trainer. While completing these exercises, remember:   · Muscles can gain both the endurance and the strength needed for everyday activities through controlled exercises.  · Complete these exercises as instructed by your physician, physical therapist or athletic trainer. Progress the resistance and repetitions only as guided.  STRENGTH - Towel  Curls  · Sit in a chair positioned on a non-carpeted surface.  · Place your foot on a towel, keeping your heel on the floor.  · Pull the towel toward your heel by only curling your toes. Keep your heel on the floor.  · If instructed by your physician, physical therapist or athletic trainer, add ____________________ at the end of the towel.  Repeat __________ times. Complete this exercise __________ times per day.  STRENGTH - Ankle Inversion  · Secure one end of a rubber exercise band/tubing to a fixed object (table, pole). Loop the other end around your foot just before your toes.  · Place your fists between your knees. This will focus your strengthening at your ankle.  · Slowly, pull your big toe up and in, making sure the band/tubing is positioned to resist the entire motion.  · Hold this position for __________ seconds.  · Have your muscles resist the band/tubing as it slowly pulls your foot back to the starting position.  Repeat __________ times. Complete this exercises __________ times per day.   Document Released: 08/29/2005 Document Revised: 11/21/2011 Document Reviewed: 12/11/2008  [employer]® Patient Information ©2014 [employer].

## 2013-08-14 NOTE — Progress Notes (Signed)
   Subjective:    Patient ID: Brianna Ward, female    DOB: 12-Dec-1970, 42 y.o.   MRN: [id number]  HPI Comments: "I have heel pain"   Patient has plantar heel pain left for approx 1 month. She has pain in the morning and gets worse as the day goes on. The pain feels more like a bruise-like ache. She has seen her PCP for and itch on her arm and he Rx'd prednisone which has helped her foot. She has also tried heel cups, OTC insoles, and new shoes with no relief.     Review of Systems  All other systems reviewed and are negative.       Objective:   Physical Exam        Assessment & Plan:

## 2013-08-19 ENCOUNTER — Ambulatory Visit: Payer: Self-pay | Admitting: Podiatry

## 2013-08-21 ENCOUNTER — Telehealth (INDEPENDENT_AMBULATORY_CARE_PROVIDER_SITE_OTHER): Payer: Self-pay

## 2013-08-21 NOTE — Telephone Encounter (Signed)
Called trying to make a new pt appt with Dr Doreen Beam after Dr Dwain Sarna spoke to Dr Irene Limbo. Dr Irene Limbo gave this recommendation for pt to see Dr Doreen Beam but he is booked already to April. They are going to check on this for me and call me back. I need to speak to them when she calls me back.

## 2013-08-21 NOTE — Telephone Encounter (Signed)
LMOM for pt to call me back. I have pt scheduled to see Dr Doreen Beam at [facility] on 09/06/13 arrive at 2:10 to register for a 2:40. If the pt would rather register on line the pt would need to call Thurston Hole at [facility] and give her the email address so she could email the paper work to complete so she wouldn't have to come so early. If not interested in that then the pt will need to check in at 2:40. The office advised me that there was a cancelation on Dr Marcelyn Bruins schedule so they were able to work her into the appt if the pt declines this appt it will be April before another appt is available. They wanted me to just make sure the pt was made a ware of this so she really would try and keep the appt for 09/06/13. I will make sure the pt is notified.

## 2013-08-22 NOTE — Telephone Encounter (Signed)
Patient called back and was given below message.  Patient is agreeable with the 09/06/13 appt.

## 2014-05-02 ENCOUNTER — Other Ambulatory Visit: Payer: Self-pay | Admitting: Obstetrics and Gynecology

## 2014-05-05 LAB — CYTOLOGY - PAP

## 2014-09-12 HISTORY — PX: ABLATION: SHX5711

## 2015-05-06 ENCOUNTER — Other Ambulatory Visit: Payer: Self-pay | Admitting: Obstetrics and Gynecology

## 2015-05-07 LAB — CYTOLOGY - PAP

## 2018-04-17 DIAGNOSIS — F4323 Adjustment disorder with mixed anxiety and depressed mood: Secondary | ICD-10-CM | POA: Diagnosis not present

## 2018-05-01 DIAGNOSIS — F4323 Adjustment disorder with mixed anxiety and depressed mood: Secondary | ICD-10-CM | POA: Diagnosis not present

## 2018-06-22 DIAGNOSIS — D224 Melanocytic nevi of scalp and neck: Secondary | ICD-10-CM | POA: Diagnosis not present

## 2018-06-22 DIAGNOSIS — D485 Neoplasm of uncertain behavior of skin: Secondary | ICD-10-CM | POA: Diagnosis not present

## 2018-06-22 DIAGNOSIS — D225 Melanocytic nevi of trunk: Secondary | ICD-10-CM | POA: Diagnosis not present

## 2018-06-22 DIAGNOSIS — L821 Other seborrheic keratosis: Secondary | ICD-10-CM | POA: Diagnosis not present

## 2018-07-06 ENCOUNTER — Encounter: Payer: Self-pay | Admitting: Plastic Surgery

## 2018-07-06 ENCOUNTER — Ambulatory Visit (INDEPENDENT_AMBULATORY_CARE_PROVIDER_SITE_OTHER): Payer: Self-pay | Admitting: Plastic Surgery

## 2018-07-06 DIAGNOSIS — Z719 Counseling, unspecified: Secondary | ICD-10-CM | POA: Insufficient documentation

## 2018-07-06 DIAGNOSIS — Z7189 Other specified counseling: Secondary | ICD-10-CM

## 2018-07-06 NOTE — Progress Notes (Signed)
Botulinum Toxin Procedure Note  Procedure: Cosmetic botulinum toxin  Pre-operative Diagnosis: Dynamic rhytides  Post-operative Diagnosis: Same  Complications:  None  Brief history: The patient desires botulinum toxin injection of her forehead. I discussed with the patient this proposed procedure of botulinum toxin injections, which is customized depending on the particular needs of the patient. It is performed on facial rhytids as a temporary correction. The alternatives were discussed with the patient. The risks were addressed including bleeding, scarring, infection, damage to deeper structures, asymmetry, and chronic pain, which may occur infrequently after a procedure. The individual's choice to undergo a surgical procedure is based on the comparison of risks to potential benefits. Other risks include unsatisfactory results, brow ptosis, eyelid ptosis, allergic reaction, temporary paralysis, which should go away with time, bruising, blurring disturbances and delayed healing. Botulinum toxin injections do not arrest the aging process or produce permanent tightening of the eyelid.  Operative intervention maybe necessary to maintain the results of a blepharoplasty or botulinum toxin. The patient understands and wishes to proceed. An informed consent was signed and informational brochures given to her prior to the procedure.  Procedure: The area was prepped with alcohol and dried with a clean gauze. Using a clean technique, the botulinum toxin was diluted with 1.25 cc of preservative-free normal saline which was slowly injected with an 18 gauge needle in a tuberculin syringes.  A 32 gauge needles were then used to inject the botulinum toxin. This mixture allow for an aliquot of 5 units per 0.1 cc in each injection site.    Subsequently the mixture was injected in the glabellar and forehead area with preservation of the temporal branch to the lateral eyebrow as well as into each lateral canthal area  beginning from the lateral orbital rim medial to the zygomaticus major in 3 separate areas. A total of 25 Units of botulinum toxin was used. The forehead and glabellar area was injected with care to inject intramuscular only while holding pressure on the supratrochlear vessels in each area during each injection on either side of the medial corrugators. The injection proceeded vertically superiorly to the medial 2/3 of the frontalis muscle and superior 2/3 of the lateral frontalis, again with preservation of the frontal branch.  No complications were noted. Light pressure was held for 5 minutes. She was instructed explicitly in post-operative care.  Botox LOT:  [id number] [id number] EXP:  3/22

## 2018-07-13 DIAGNOSIS — Z681 Body mass index (BMI) 19 or less, adult: Secondary | ICD-10-CM | POA: Diagnosis not present

## 2018-07-13 DIAGNOSIS — Z01419 Encounter for gynecological examination (general) (routine) without abnormal findings: Secondary | ICD-10-CM | POA: Diagnosis not present

## 2018-07-13 DIAGNOSIS — Z1231 Encounter for screening mammogram for malignant neoplasm of breast: Secondary | ICD-10-CM | POA: Diagnosis not present

## 2018-10-05 ENCOUNTER — Ambulatory Visit (INDEPENDENT_AMBULATORY_CARE_PROVIDER_SITE_OTHER): Payer: Self-pay | Admitting: Plastic Surgery

## 2018-10-05 ENCOUNTER — Encounter: Payer: Self-pay | Admitting: Plastic Surgery

## 2018-10-05 VITALS — BP 113/75 | HR 74 | Temp 98.1°F | Ht 67.0 in | Wt 119.0 lb

## 2018-10-05 DIAGNOSIS — Z719 Counseling, unspecified: Secondary | ICD-10-CM

## 2018-10-05 NOTE — Progress Notes (Signed)
Botulinum Toxin Procedure Note  Procedure: Cosmetic botulinum toxin   Pre-operative Diagnosis: Dynamic rhytides   Post-operative Diagnosis: Same  Complications:  None  Brief history: The patient desires botulinum toxin injection of her forehead. I discussed with the patient this proposed procedure of botulinum toxin injections, which is customized depending on the particular needs of the patient. It is performed on facial rhytids as a temporary correction. The alternatives were discussed with the patient. The risks were addressed including bleeding, scarring, infection, damage to deeper structures, asymmetry, and chronic pain, which may occur infrequently after a procedure. The individual's choice to undergo a surgical procedure is based on the comparison of risks to potential benefits. Other risks include unsatisfactory results, brow ptosis, eyelid ptosis, allergic reaction, temporary paralysis, which should go away with time, bruising, blurring disturbances and delayed healing. Botulinum toxin injections do not arrest the aging process or produce permanent tightening of the eyelid.  Operative intervention maybe necessary to maintain the results of a blepharoplasty or botulinum toxin. The patient understands and wishes to proceed. An informed consent was signed and informational brochures given to her prior to the procedure.  Procedure: The area was prepped with alcohol and dried with a clean gauze. Using a clean technique, the botulinum toxin was diluted with 1.25 cc of preservative-free normal saline which was slowly injected with an 18 gauge needle in a tuberculin syringes.  A 32 gauge needles were then used to inject the botulinum toxin. This mixture allow for an aliquot of 5 units per 0.1 cc in each injection site.    Subsequently the mixture was injected in the glabellar and forehead area with preservation of the temporal branch to the lateral eyebrow as well as into each lateral canthal area  beginning from the lateral orbital rim medial to the zygomaticus major in 3 separate areas. A total of 25 Units of botulinum toxin was used. The forehead and glabellar area was injected with care to inject intramuscular only while holding pressure on the supratrochlear vessels in each area during each injection on either side of the medial corrugators. The injection proceeded vertically superiorly to the medial 2/3 of the frontalis muscle and superior 2/3 of the lateral frontalis, again with preservation of the frontal branch.  No complications were noted. Light pressure was held for 5 minutes. She was instructed explicitly in post-operative care.  Botox LOT:  [id number] C2 EXP:  5/22

## 2019-04-19 DIAGNOSIS — R5383 Other fatigue: Secondary | ICD-10-CM | POA: Diagnosis not present

## 2019-04-19 DIAGNOSIS — R42 Dizziness and giddiness: Secondary | ICD-10-CM | POA: Diagnosis not present

## 2019-04-25 DIAGNOSIS — F325 Major depressive disorder, single episode, in full remission: Secondary | ICD-10-CM | POA: Diagnosis not present

## 2019-04-25 DIAGNOSIS — R630 Anorexia: Secondary | ICD-10-CM | POA: Diagnosis not present

## 2019-04-25 DIAGNOSIS — R51 Headache: Secondary | ICD-10-CM | POA: Diagnosis not present

## 2019-04-25 DIAGNOSIS — R42 Dizziness and giddiness: Secondary | ICD-10-CM | POA: Diagnosis not present

## 2019-04-25 DIAGNOSIS — Z1331 Encounter for screening for depression: Secondary | ICD-10-CM | POA: Diagnosis not present

## 2019-04-25 DIAGNOSIS — Z0000 Encounter for general adult medical examination without abnormal findings: Secondary | ICD-10-CM | POA: Diagnosis not present

## 2019-04-26 ENCOUNTER — Encounter: Payer: Self-pay | Admitting: Gastroenterology

## 2019-04-26 DIAGNOSIS — Z20828 Contact with and (suspected) exposure to other viral communicable diseases: Secondary | ICD-10-CM | POA: Diagnosis not present

## 2019-05-16 ENCOUNTER — Other Ambulatory Visit: Payer: Self-pay

## 2019-05-16 ENCOUNTER — Ambulatory Visit (AMBULATORY_SURGERY_CENTER): Payer: Self-pay

## 2019-05-16 VITALS — Ht 67.0 in | Wt 120.6 lb

## 2019-05-16 DIAGNOSIS — Z1211 Encounter for screening for malignant neoplasm of colon: Secondary | ICD-10-CM

## 2019-05-16 MED ORDER — NA SULFATE-K SULFATE-MG SULF 17.5-3.13-1.6 GM/177ML PO SOLN: 1.0000 | Freq: Once | ORAL | 0 refills | Status: AC

## 2019-05-16 NOTE — Progress Notes (Signed)
Denies allergies to eggs or soy products. Denies complication of anesthesia or sedation. Denies use of weight loss medication. Denies use of O2.   Emmi instructions given for colonoscopy.  

## 2019-05-17 ENCOUNTER — Encounter: Payer: Self-pay | Admitting: Gastroenterology

## 2019-05-21 ENCOUNTER — Encounter: Payer: Self-pay | Admitting: Plastic Surgery

## 2019-05-21 ENCOUNTER — Other Ambulatory Visit: Payer: Self-pay

## 2019-05-21 ENCOUNTER — Ambulatory Visit (INDEPENDENT_AMBULATORY_CARE_PROVIDER_SITE_OTHER): Payer: Self-pay | Admitting: Plastic Surgery

## 2019-05-21 VITALS — BP 111/77 | HR 64 | Temp 97.8°F | Ht 67.0 in | Wt 118.2 lb

## 2019-05-21 DIAGNOSIS — Z719 Counseling, unspecified: Secondary | ICD-10-CM

## 2019-05-21 NOTE — Progress Notes (Signed)
Botulinum Toxin Procedure Note  Procedure: Cosmetic botulinum toxin   Pre-operative Diagnosis: Dynamic rhytides   Post-operative Diagnosis: Same  Complications:  None  Brief history: The patient desires botulinum toxin injection of her forehead. I discussed with the patient this proposed procedure of botulinum toxin injections, which is customized depending on the particular needs of the patient. It is performed on facial rhytids as a temporary correction. The alternatives were discussed with the patient. The risks were addressed including bleeding, scarring, infection, damage to deeper structures, asymmetry, and chronic pain, which may occur infrequently after a procedure. The individual's choice to undergo a surgical procedure is based on the comparison of risks to potential benefits. Other risks include unsatisfactory results, brow ptosis, eyelid ptosis, allergic reaction, temporary paralysis, which should go away with time, bruising, blurring disturbances and delayed healing. Botulinum toxin injections do not arrest the aging process or produce permanent tightening of the eyelid.  Operative intervention maybe necessary to maintain the results of a blepharoplasty or botulinum toxin. The patient understands and wishes to proceed. An informed consent was signed and informational brochures given to her prior to the procedure.  Procedure: The area was prepped with alcohol and dried with a clean gauze. Using a clean technique, the botulinum toxin was diluted with 1.25 cc of preservative-free normal saline which was slowly injected with an 18 gauge needle in a tuberculin syringes.  A 32 gauge needles were then used to inject the botulinum toxin. This mixture allow for an aliquot of 5 units per 0.1 cc in each injection site.    Subsequently the mixture was injected in the glabellar and forehead area with preservation of the temporal branch to the lateral eyebrow as well as into each lateral canthal area  beginning from the lateral orbital rim medial to the zygomaticus major in 3 separate areas. A total of 22 Units of botulinum toxin was used. The forehead and glabellar area was injected with care to inject intramuscular only while holding pressure on the supratrochlear vessels in each area during each injection on either side of the medial corrugators. The injection proceeded vertically superiorly to the medial 2/3 of the frontalis muscle and superior 2/3 of the lateral frontalis, again with preservation of the frontal branch.  No complications were noted. Light pressure was held for 5 minutes. She was instructed explicitly in post-operative care.  Botox LOT:  [id number] C2 EXP:  4/23

## 2019-05-29 ENCOUNTER — Telehealth: Payer: Self-pay

## 2019-05-29 NOTE — Telephone Encounter (Signed)
Pt answered no to all covid- 19 questions.

## 2019-05-29 NOTE — Telephone Encounter (Signed)
Covid-19 screening questions   Do you now or have you had a fever in the last 14 days?  Do you have any respiratory symptoms of shortness of breath or cough now or in the last 14 days?  Do you have any family members or close contacts with diagnosed or suspected Covid-19 in the past 14 days?  Have you been tested for Covid-19 and found to be positive?       

## 2019-05-30 ENCOUNTER — Other Ambulatory Visit: Payer: Self-pay

## 2019-05-30 ENCOUNTER — Encounter: Payer: Self-pay | Admitting: Gastroenterology

## 2019-05-30 ENCOUNTER — Other Ambulatory Visit: Payer: Self-pay | Admitting: Gastroenterology

## 2019-05-30 ENCOUNTER — Ambulatory Visit (AMBULATORY_SURGERY_CENTER): Payer: BC Managed Care – PPO | Admitting: Gastroenterology

## 2019-05-30 VITALS — BP 103/70 | HR 55 | Temp 97.6°F | Resp 14 | Ht 67.0 in | Wt 120.0 lb

## 2019-05-30 DIAGNOSIS — D120 Benign neoplasm of cecum: Secondary | ICD-10-CM

## 2019-05-30 DIAGNOSIS — Z1211 Encounter for screening for malignant neoplasm of colon: Secondary | ICD-10-CM

## 2019-05-30 DIAGNOSIS — K635 Polyp of colon: Secondary | ICD-10-CM

## 2019-05-30 DIAGNOSIS — D122 Benign neoplasm of ascending colon: Secondary | ICD-10-CM

## 2019-05-30 MED ORDER — SODIUM CHLORIDE 0.9 % IV SOLN: 500.0000 mL | Freq: Once | INTRAVENOUS | Status: DC

## 2019-05-30 NOTE — Progress Notes (Signed)
PT taken to PACU. Monitors in place. VSS. Report given to RN. 

## 2019-05-30 NOTE — Op Note (Signed)
[place] Patient Name: Brianna Ward Procedure Date: 05/30/2019 10:31 AM MRN: [id number] Endoscopist: Mauri Pole , MD Age: 48 Referring MD:  Date of Birth: 1971-06-14 Gender: Female Account #: [id number] Procedure:                Colonoscopy Indications:              Screening for colorectal malignant neoplasm Medicines:                Monitored Anesthesia Care Procedure:                Pre-Anesthesia Assessment:                           - Prior to the procedure, a History and Physical                            was performed, and patient medications and                            allergies were reviewed. The patient's tolerance of                            previous anesthesia was also reviewed. The risks                            and benefits of the procedure and the sedation                            options and risks were discussed with the patient.                            All questions were answered, and informed consent                            was obtained. Prior Anticoagulants: The patient has                            taken no previous anticoagulant or antiplatelet                            agents. ASA Grade Assessment: I - A normal, healthy                            patient. After reviewing the risks and benefits,                            the patient was deemed in satisfactory condition to                            undergo the procedure.                           After obtaining informed consent, the colonoscope  was passed under direct vision. Throughout the                            procedure, the patient's blood pressure, pulse, and                            oxygen saturations were monitored continuously. The                            Colonoscope was introduced through the anus and                            advanced to the the cecum, identified by                            appendiceal orifice and ileocecal  valve. The                            colonoscopy was performed without difficulty. The                            patient tolerated the procedure well. The quality                            of the bowel preparation was excellent. The                            ileocecal valve, appendiceal orifice, and rectum                            were photographed. Scope In: 10:33:53 AM Scope Out: 10:54:15 AM Scope Withdrawal Time: 0 hours 15 minutes 24 seconds  Total Procedure Duration: 0 hours 20 minutes 22 seconds  Findings:                 The perianal and digital rectal examinations were                            normal.                           Two sessile polyps were found in the ascending                            colon and cecum. The polyps were 10 to 14 mm in                            size. These polyps were removed with a piecemeal                            technique using a cold snare. Resection and                            retrieval were complete.  Non-bleeding internal hemorrhoids were found during                            retroflexion. The hemorrhoids were small. Complications:            No immediate complications. Estimated Blood Loss:     Estimated blood loss was minimal. Impression:               - Two 10 to 14 mm polyps in the ascending colon and                            in the cecum, removed piecemeal using a cold snare.                            Resected and retrieved.                           - Non-bleeding internal hemorrhoids. Recommendation:           - Patient has a contact number available for                            emergencies. The signs and symptoms of potential                            delayed complications were discussed with the                            patient. Return to normal activities tomorrow.                            Written discharge instructions were provided to the                            patient.                            - Resume previous diet.                           - Continue present medications.                           - Await pathology results.                           - Repeat colonoscopy in 3 years for surveillance                            based on pathology results. Mauri Pole, MD 05/30/2019 11:01:24 AM This report has been signed electronically.

## 2019-05-30 NOTE — Progress Notes (Signed)
Called to room to assist during endoscopic procedure.  Patient ID and intended procedure confirmed with present staff. Received instructions for my participation in the procedure from the performing physician.  

## 2019-05-30 NOTE — Patient Instructions (Addendum)
Information on polyps and hemorrhoids given to you today.  Await pathology results.  No aspirin, ibuprofen, naproxen or any other non steroidal anti inflammatory medications for two weeks.  YOU HAD AN ENDOSCOPIC PROCEDURE TODAY AT [facility]:   Refer to the procedure report that was given to you for any specific questions about what was found during the examination.  If the procedure report does not answer your questions, please call your gastroenterologist to clarify.  If you requested that your care partner not be given the details of your procedure findings, then the procedure report has been included in a sealed envelope for you to review at your convenience later.  YOU SHOULD EXPECT: Some feelings of bloating in the abdomen. Passage of more gas than usual.  Walking can help get rid of the air that was put into your GI tract during the procedure and reduce the bloating. If you had a lower endoscopy (such as a colonoscopy or flexible sigmoidoscopy) you may notice spotting of blood in your stool or on the toilet paper. If you underwent a bowel prep for your procedure, you may not have a normal bowel movement for a few days.  Please Note:  You might notice some irritation and congestion in your nose or some drainage.  This is from the oxygen used during your procedure.  There is no need for concern and it should clear up in a day or so.  SYMPTOMS TO REPORT IMMEDIATELY:   Following lower endoscopy (colonoscopy or flexible sigmoidoscopy):  Excessive amounts of blood in the stool  Significant tenderness or worsening of abdominal pains  Swelling of the abdomen that is new, acute  Fever of 100F or higher   For urgent or emergent issues, a gastroenterologist can be reached at any hour by calling [phone].   DIET:  We do recommend a small meal at first, but then you may proceed to your regular diet.  Drink plenty of fluids but you should avoid alcoholic beverages for 24  hours.  ACTIVITY:  You should plan to take it easy for the rest of today and you should NOT DRIVE or use heavy machinery until tomorrow (because of the sedation medicines used during the test).    FOLLOW UP: Our staff will call the number listed on your records 48-72 hours following your procedure to check on you and address any questions or concerns that you may have regarding the information given to you following your procedure. If we do not reach you, we will leave a message.  We will attempt to reach you two times.  During this call, we will ask if you have developed any symptoms of COVID 19. If you develop any symptoms (ie: fever, flu-like symptoms, shortness of breath, cough etc.) before then, please call [phone].  If you test positive for Covid 19 in the 2 weeks post procedure, please call and report this information to [place].    If any biopsies were taken you will be contacted by phone or by letter within the next 1-3 weeks.  Please call us at [phone] if you have not heard about the biopsies in 3 weeks.    SIGNATURES/CONFIDENTIALITY: You and/or your care partner have signed paperwork which will be entered into your electronic medical record.  These signatures attest to the fact that that the information above on your After Visit Summary has been reviewed and is understood.  Full responsibility of the confidentiality of this discharge information lies with you  and/or your care-partner. 

## 2019-06-03 ENCOUNTER — Telehealth: Payer: Self-pay | Admitting: *Deleted

## 2019-06-03 NOTE — Telephone Encounter (Signed)
1. Have you developed a fever since your procedure? no  2.   Have you had an respiratory symptoms (SOB or cough) since your procedure? mo  3.   Have you tested positive for COVID 19 since your procedure mo  4.   Have you had any family members/close contacts diagnosed with the COVID 19 since your procedure?  no   If yes to any of these questions please route to Joylene John, RN and Alphonsa Gin, Therapist, sports.  Follow up Call-  Call back number 05/30/2019  Post procedure Call Back phone  # [phone]  Permission to leave phone message Yes  Some recent data might be hidden     Patient questions:  Do you have a fever, pain , or abdominal swelling? No. Pain Score  0 *  Have you tolerated food without any problems? Yes.    Have you been able to return to your normal activities? Yes.    Do you have any questions about your discharge instructions: Diet   No. Medications  No. Follow up visit  No.  Do you have questions or concerns about your Care? No.  Actions: * If pain score is 4 or above: No action needed, pain <4.

## 2019-06-07 ENCOUNTER — Encounter: Payer: Self-pay | Admitting: Gastroenterology

## 2019-06-25 DIAGNOSIS — R5383 Other fatigue: Secondary | ICD-10-CM | POA: Diagnosis not present

## 2019-06-25 DIAGNOSIS — Z20828 Contact with and (suspected) exposure to other viral communicable diseases: Secondary | ICD-10-CM | POA: Diagnosis not present

## 2019-06-25 DIAGNOSIS — R519 Headache, unspecified: Secondary | ICD-10-CM | POA: Diagnosis not present

## 2019-07-19 ENCOUNTER — Other Ambulatory Visit: Payer: Self-pay

## 2019-07-19 DIAGNOSIS — Z20822 Contact with and (suspected) exposure to covid-19: Secondary | ICD-10-CM

## 2019-07-20 LAB — NOVEL CORONAVIRUS, NAA: SARS-CoV-2, NAA: NOT DETECTED

## 2019-08-23 ENCOUNTER — Encounter: Payer: BC Managed Care – PPO | Admitting: Plastic Surgery

## 2019-09-20 ENCOUNTER — Encounter: Payer: BC Managed Care – PPO | Admitting: Plastic Surgery

## 2019-09-30 DIAGNOSIS — Z681 Body mass index (BMI) 19 or less, adult: Secondary | ICD-10-CM | POA: Diagnosis not present

## 2019-09-30 DIAGNOSIS — Z01419 Encounter for gynecological examination (general) (routine) without abnormal findings: Secondary | ICD-10-CM | POA: Diagnosis not present

## 2019-09-30 DIAGNOSIS — Z1231 Encounter for screening mammogram for malignant neoplasm of breast: Secondary | ICD-10-CM | POA: Diagnosis not present

## 2019-10-15 ENCOUNTER — Other Ambulatory Visit: Payer: Self-pay

## 2019-10-15 ENCOUNTER — Encounter: Payer: Self-pay | Admitting: Plastic Surgery

## 2019-10-15 ENCOUNTER — Ambulatory Visit (INDEPENDENT_AMBULATORY_CARE_PROVIDER_SITE_OTHER): Payer: Self-pay | Admitting: Plastic Surgery

## 2019-10-15 VITALS — BP 114/82 | HR 70 | Temp 97.6°F | Resp 16 | Ht 67.0 in | Wt 118.0 lb

## 2019-10-15 DIAGNOSIS — Z719 Counseling, unspecified: Secondary | ICD-10-CM

## 2019-10-15 MED ORDER — LIDOCAINE-PRILOCAINE 2.5-2.5 % EX CREA: 1.0000 "application " | TOPICAL_CREAM | CUTANEOUS | 0 refills | Status: DC | PRN

## 2019-10-15 NOTE — Addendum Note (Signed)
Addended by: Wallace Going on: 10/15/2019 09:12 AM   Modules accepted: Orders

## 2019-10-15 NOTE — Progress Notes (Addendum)
Botulinum Toxin Procedure Note  Procedure: Cosmetic botulinum toxin  Pre-operative Diagnosis: Dynamic rhytides  Post-operative Diagnosis: Same  Complications:  None  Brief history: The patient desires botulinum toxin injection of her forehead. I discussed with the patient this proposed procedure of botulinum toxin injections, which is customized depending on the particular needs of the patient. It is performed on facial rhytids as a temporary correction. The alternatives were discussed with the patient. The risks were addressed including bleeding, scarring, infection, damage to deeper structures, asymmetry, and chronic pain, which may occur infrequently after a procedure. The individual's choice to undergo a surgical procedure is based on the comparison of risks to potential benefits. Other risks include unsatisfactory results, brow ptosis, eyelid ptosis, allergic reaction, temporary paralysis, which should go away with time, bruising, blurring disturbances and delayed healing. Botulinum toxin injections do not arrest the aging process or produce permanent tightening of the eyelid.  Operative intervention maybe necessary to maintain the results of a blepharoplasty or botulinum toxin. The patient understands and wishes to proceed. An informed consent was signed and informational brochures given to her prior to the procedure.  Procedure: The area was prepped with alcohol and dried with a clean gauze. Using a clean technique, the botulinum toxin was diluted with 1.25 cc of preservative-free normal saline which was slowly injected with an 18 gauge needle in a tuberculin syringes.  A 32 gauge needles were then used to inject the botulinum toxin. This mixture allow for an aliquot of 5 units per 0.1 cc in each injection site.    Subsequently the mixture was injected in the glabellar and forehead area with preservation of the temporal branch to the lateral eyebrow as well as into each lateral canthal area  beginning from the lateral orbital rim medial to the zygomaticus major in 3 separate areas. A total of 22 Units of botulinum toxin was used. The forehead and glabellar area was injected with care to inject intramuscular only while holding pressure on the supratrochlear vessels in each area during each injection on either side of the medial corrugators. The injection proceeded vertically superiorly to the medial 2/3 of the frontalis muscle and superior 2/3 of the lateral frontalis, again with preservation of the frontal branch.  No complications were noted. Light pressure was held for 5 minutes. She was instructed explicitly in post-operative care.  Botox LOT:  [id number] [id number] EXP:  10/23  Preoperative Dx: hyperpigmentation  Postoperative Dx:  same  Procedure: laser to face   Anesthesia: none  Description of Procedure:  Risks and complications were explained to the patient. Consent was confirmed and signed. Time out was called and all information was confirmed to be correct. The area  area was prepped with alcohol and wiped dry. The IPL laser was set at 5 J/cm2. The face was lasered. The patient tolerated the procedure well and there were no complications. The patient is to follow up in 4 weeks.

## 2019-10-16 DIAGNOSIS — Z13820 Encounter for screening for osteoporosis: Secondary | ICD-10-CM | POA: Diagnosis not present

## 2019-10-25 DIAGNOSIS — Z23 Encounter for immunization: Secondary | ICD-10-CM | POA: Diagnosis not present

## 2019-11-11 ENCOUNTER — Ambulatory Visit (INDEPENDENT_AMBULATORY_CARE_PROVIDER_SITE_OTHER): Payer: Self-pay

## 2019-11-11 ENCOUNTER — Other Ambulatory Visit: Payer: Self-pay

## 2019-11-11 VITALS — BP 100/70 | HR 70 | Temp 97.8°F | Ht 67.0 in | Wt 115.0 lb

## 2019-11-11 DIAGNOSIS — Z411 Encounter for cosmetic surgery: Secondary | ICD-10-CM

## 2019-11-11 NOTE — Patient Instructions (Signed)
Preoperative Dx: facial aging  Postoperative Dx:  same  Procedure: laser to face  Anesthesia: EMLA -pt applied 40 mins prior to procedure  Description of Procedure:  Risks and complications were explained to the patient. Consent was confirmed and signed. Time out was called and all information was confirmed to be correct. The area  was prepped with alcohol and wiped dry. The IPL laser was set at the following:  skin -2/ suntan light -  7-for cheeks & decreased to 6  for forehead/mouth  & eye area  The entire face was lasered The patient tolerated the procedure well and there were no complications.   Gel removed & Aloe applied  She is reminded to protect skin with sunscreen & moisturizer & avoid retina products & no abrasive scrubs for approx. 7 to 10 days  She is reminded that she may experience redness/peeling & irritation Patient to f/u in 4-6 weeks- she plans to have a 3rd IPL laser procedure at her next appointment  She will call for any concerns [facility]

## 2019-11-22 DIAGNOSIS — Z23 Encounter for immunization: Secondary | ICD-10-CM | POA: Diagnosis not present

## 2019-12-23 ENCOUNTER — Other Ambulatory Visit: Payer: Self-pay

## 2019-12-23 ENCOUNTER — Ambulatory Visit (INDEPENDENT_AMBULATORY_CARE_PROVIDER_SITE_OTHER): Payer: Self-pay

## 2019-12-23 VITALS — BP 98/66 | HR 73 | Temp 97.8°F | Ht 67.0 in | Wt 117.0 lb

## 2019-12-23 DIAGNOSIS — Z719 Counseling, unspecified: Secondary | ICD-10-CM

## 2019-12-23 NOTE — Progress Notes (Signed)
Preoperative Dx: facial aging  Postoperative Dx:  same  Procedure: laser to face  Anesthesia: EMLA -pt applied 40 mins prior to procedure  Description of Procedure:  Risks and complications were explained to the patient. Consent was confirmed and signed. Time out was called and all information was confirmed to be correct. The area  was prepped with alcohol and wiped dry. The IPL laser was set at the following:  skin -2/ suntan light -  7-for cheeks & decreased to 6  for forehead/mouth  & eye area  The entire face was lasered The patient tolerated the procedure well and there were no complications.   Gel removed & Aloe applied  She is reminded to protect skin with sunscreen & moisturizer & avoid retina products & no abrasive scrubs for approx. 7 to 10 days  She is reminded that she may experience redness/peeling & irritation Patient to f/u in 4-6 weeks- she plans to have a 3rd IPL laser procedure at her next appointment  She will call for any concerns [facility]

## 2020-02-07 NOTE — Patient Instructions (Signed)
Protect skin from sun- use sunscreen Moisturize, avoid retina products and do not use scrubs for approx. 2 weeks  call for any concerns

## 2020-02-11 NOTE — Progress Notes (Signed)
Preoperative Dx: facial aging  Postoperative Dx:  same  Procedure: laser to face  Anesthesia: EMLA -pt applied 40 mins prior to procedure  Description of Procedure:  Risks and complications were explained to the patient. Consent was confirmed and signed. Time out was called and all information was confirmed to be correct. The area was prepped with alcohol and wiped dry.  The IPL laser was set at the following:  skin -2/ suntan light - 7.0J for face/cheeks & decreased to 6.0 for forehead & eye area/mouth  The entire face was lasered The patient tolerated the procedure well and there were no complications.  Aloe applied & provided ice for pt to use She is reminded to protect skin with sunscreen & moisturizer & avoid retina products & no abrasive scrubs for approx. 7 to 10 days  She is reminded that she may experience redness/peeling & irritation Patient to f/u in 6 weeks  She will call for any concerns [facility]

## 2020-04-21 DIAGNOSIS — D509 Iron deficiency anemia, unspecified: Secondary | ICD-10-CM | POA: Diagnosis not present

## 2020-04-21 DIAGNOSIS — Z0000 Encounter for general adult medical examination without abnormal findings: Secondary | ICD-10-CM | POA: Diagnosis not present

## 2020-04-22 DIAGNOSIS — R7989 Other specified abnormal findings of blood chemistry: Secondary | ICD-10-CM | POA: Diagnosis not present

## 2020-04-28 DIAGNOSIS — F325 Major depressive disorder, single episode, in full remission: Secondary | ICD-10-CM | POA: Diagnosis not present

## 2020-04-28 DIAGNOSIS — Z0000 Encounter for general adult medical examination without abnormal findings: Secondary | ICD-10-CM | POA: Diagnosis not present

## 2020-05-12 ENCOUNTER — Encounter: Payer: Self-pay | Admitting: Plastic Surgery

## 2020-05-12 ENCOUNTER — Other Ambulatory Visit: Payer: Self-pay

## 2020-05-12 ENCOUNTER — Ambulatory Visit (INDEPENDENT_AMBULATORY_CARE_PROVIDER_SITE_OTHER): Payer: Self-pay | Admitting: Plastic Surgery

## 2020-05-12 VITALS — BP 102/68 | HR 69

## 2020-05-12 DIAGNOSIS — Z719 Counseling, unspecified: Secondary | ICD-10-CM

## 2020-05-12 NOTE — Progress Notes (Signed)

## 2020-10-06 DIAGNOSIS — Z20822 Contact with and (suspected) exposure to covid-19: Secondary | ICD-10-CM | POA: Diagnosis not present

## 2020-10-08 DIAGNOSIS — Z01419 Encounter for gynecological examination (general) (routine) without abnormal findings: Secondary | ICD-10-CM | POA: Diagnosis not present

## 2020-10-08 DIAGNOSIS — Z1231 Encounter for screening mammogram for malignant neoplasm of breast: Secondary | ICD-10-CM | POA: Diagnosis not present

## 2020-10-08 DIAGNOSIS — Z681 Body mass index (BMI) 19 or less, adult: Secondary | ICD-10-CM | POA: Diagnosis not present

## 2020-11-10 ENCOUNTER — Ambulatory Visit (INDEPENDENT_AMBULATORY_CARE_PROVIDER_SITE_OTHER): Payer: Self-pay | Admitting: Plastic Surgery

## 2020-11-10 ENCOUNTER — Other Ambulatory Visit: Payer: Self-pay

## 2020-11-10 ENCOUNTER — Encounter: Payer: Self-pay | Admitting: Plastic Surgery

## 2020-11-10 VITALS — BP 112/80 | HR 69

## 2020-11-10 DIAGNOSIS — Z719 Counseling, unspecified: Secondary | ICD-10-CM

## 2020-11-10 NOTE — Progress Notes (Signed)

## 2020-11-17 ENCOUNTER — Telehealth: Payer: Self-pay | Admitting: Plastic Surgery

## 2020-11-17 NOTE — Telephone Encounter (Signed)
Returned patients call. Patient had received Botox on 11/10/20. She noticed the following day she had a red streak in her left lower portion of her eye. Verified with Dr. Marla Roe this is not related to the injections.  If it continues to get worse, or not better, call her opthalmologist or PC for further observation. Patient understood and agreed with plan.

## 2020-11-17 NOTE — Telephone Encounter (Signed)
Patient received botox last week and said she has a red streak in her left eye which has never happened before after botox. She was curious if it was a normal reaction or if she should be doing something to prevent it from worsening. Should she be concerned about this? Please call her to advise. #[phone]

## 2020-11-27 DIAGNOSIS — H04123 Dry eye syndrome of bilateral lacrimal glands: Secondary | ICD-10-CM | POA: Diagnosis not present

## 2020-11-27 DIAGNOSIS — H1132 Conjunctival hemorrhage, left eye: Secondary | ICD-10-CM | POA: Diagnosis not present

## 2021-05-04 DIAGNOSIS — D509 Iron deficiency anemia, unspecified: Secondary | ICD-10-CM | POA: Diagnosis not present

## 2022-04-22 ENCOUNTER — Ambulatory Visit: Payer: Self-pay | Admitting: Plastic Surgery

## 2022-04-22 ENCOUNTER — Encounter: Payer: Self-pay | Admitting: Plastic Surgery

## 2022-04-22 DIAGNOSIS — Z719 Counseling, unspecified: Secondary | ICD-10-CM

## 2022-04-22 NOTE — Progress Notes (Signed)

## 2022-06-13 ENCOUNTER — Encounter: Payer: Self-pay | Admitting: Gastroenterology

## 2022-06-30 ENCOUNTER — Other Ambulatory Visit: Payer: Self-pay | Admitting: Internal Medicine

## 2022-06-30 DIAGNOSIS — E049 Nontoxic goiter, unspecified: Secondary | ICD-10-CM

## 2022-07-11 ENCOUNTER — Ambulatory Visit
Admission: RE | Admit: 2022-07-11 | Discharge: 2022-07-11 | Disposition: A | Discharge status: Home / Self Care | Payer: BC Managed Care – PPO | Source: Ambulatory Visit | Attending: Internal Medicine | Admitting: Internal Medicine

## 2022-07-11 DIAGNOSIS — E049 Nontoxic goiter, unspecified: Secondary | ICD-10-CM

## 2022-08-19 ENCOUNTER — Encounter: Payer: Self-pay | Admitting: Plastic Surgery

## 2022-08-19 ENCOUNTER — Ambulatory Visit (INDEPENDENT_AMBULATORY_CARE_PROVIDER_SITE_OTHER): Payer: Self-pay | Admitting: Plastic Surgery

## 2022-08-19 VITALS — BP 104/55 | HR 87

## 2022-08-19 DIAGNOSIS — Z719 Counseling, unspecified: Secondary | ICD-10-CM

## 2022-08-19 NOTE — Progress Notes (Signed)

## 2023-01-24 ENCOUNTER — Ambulatory Visit (INDEPENDENT_AMBULATORY_CARE_PROVIDER_SITE_OTHER): Payer: Self-pay | Admitting: Plastic Surgery

## 2023-01-24 ENCOUNTER — Encounter: Payer: Self-pay | Admitting: Plastic Surgery

## 2023-01-24 DIAGNOSIS — Z719 Counseling, unspecified: Secondary | ICD-10-CM

## 2023-01-24 NOTE — Progress Notes (Signed)

## 2023-05-23 ENCOUNTER — Encounter: Payer: Self-pay | Admitting: Plastic Surgery

## 2023-05-23 ENCOUNTER — Ambulatory Visit (INDEPENDENT_AMBULATORY_CARE_PROVIDER_SITE_OTHER): Payer: Self-pay | Admitting: Plastic Surgery

## 2023-05-23 DIAGNOSIS — Z719 Counseling, unspecified: Secondary | ICD-10-CM

## 2023-05-23 NOTE — Progress Notes (Signed)

## 2023-07-26 ENCOUNTER — Encounter: Payer: Self-pay | Admitting: Gastroenterology

## 2024-05-31 ENCOUNTER — Ambulatory Visit (INDEPENDENT_AMBULATORY_CARE_PROVIDER_SITE_OTHER): Payer: Self-pay | Admitting: Plastic Surgery

## 2024-05-31 VITALS — BP 119/76 | HR 71

## 2024-05-31 DIAGNOSIS — Z719 Counseling, unspecified: Secondary | ICD-10-CM

## 2024-05-31 NOTE — Progress Notes (Signed)
